# Patient Record
Sex: Female | Born: 1985 | Race: White | Hispanic: No | Marital: Married | Smoking: Never smoker
Health system: Southern US, Community
[De-identification: ages and names within clinical notes are randomized; demographics above are authoritative.]

## PROBLEM LIST (undated history)

## (undated) ENCOUNTER — Inpatient Hospital Stay (HOSPITAL_COMMUNITY): Payer: Self-pay

## (undated) DIAGNOSIS — N200 Calculus of kidney: Secondary | ICD-10-CM

## (undated) HISTORY — DX: Calculus of kidney: N20.0

## (undated) HISTORY — PX: APPENDECTOMY: SHX54

---

## 2012-05-24 ENCOUNTER — Other Ambulatory Visit (HOSPITAL_COMMUNITY): Payer: Self-pay | Admitting: Family Medicine

## 2012-05-24 DIAGNOSIS — O3680X1 Pregnancy with inconclusive fetal viability, fetus 1: Secondary | ICD-10-CM

## 2012-05-25 ENCOUNTER — Other Ambulatory Visit (HOSPITAL_COMMUNITY): Payer: Self-pay | Admitting: Family Medicine

## 2012-05-25 DIAGNOSIS — O3680X1 Pregnancy with inconclusive fetal viability, fetus 1: Secondary | ICD-10-CM

## 2012-05-29 ENCOUNTER — Ambulatory Visit (HOSPITAL_COMMUNITY): Payer: PRIVATE HEALTH INSURANCE

## 2012-06-01 ENCOUNTER — Ambulatory Visit (HOSPITAL_COMMUNITY): Payer: PRIVATE HEALTH INSURANCE

## 2012-06-01 ENCOUNTER — Ambulatory Visit (HOSPITAL_COMMUNITY)
Admission: RE | Admit: 2012-06-01 | Discharge: 2012-06-01 | Disposition: A | Discharge status: Home / Self Care | Payer: PRIVATE HEALTH INSURANCE | Source: Ambulatory Visit | Attending: Family Medicine | Admitting: Family Medicine

## 2012-06-01 DIAGNOSIS — Z3689 Encounter for other specified antenatal screening: Secondary | ICD-10-CM | POA: Insufficient documentation

## 2012-06-01 DIAGNOSIS — O3680X1 Pregnancy with inconclusive fetal viability, fetus 1: Secondary | ICD-10-CM

## 2012-06-01 DIAGNOSIS — O3680X0 Pregnancy with inconclusive fetal viability, not applicable or unspecified: Secondary | ICD-10-CM | POA: Insufficient documentation

## 2012-07-11 LAB — HM PAP SMEAR: HM PAP: NORMAL

## 2013-10-17 ENCOUNTER — Other Ambulatory Visit (HOSPITAL_COMMUNITY): Payer: Self-pay | Admitting: Nurse Practitioner

## 2013-10-17 DIAGNOSIS — O3680X1 Pregnancy with inconclusive fetal viability, fetus 1: Secondary | ICD-10-CM

## 2013-10-22 ENCOUNTER — Ambulatory Visit (HOSPITAL_COMMUNITY)
Admission: RE | Admit: 2013-10-22 | Discharge: 2013-10-22 | Disposition: A | Discharge status: Home / Self Care | Payer: PRIVATE HEALTH INSURANCE | Source: Ambulatory Visit | Attending: Family Medicine | Admitting: Family Medicine

## 2013-10-22 DIAGNOSIS — O3680X0 Pregnancy with inconclusive fetal viability, not applicable or unspecified: Secondary | ICD-10-CM | POA: Insufficient documentation

## 2013-10-22 DIAGNOSIS — O3680X1 Pregnancy with inconclusive fetal viability, fetus 1: Secondary | ICD-10-CM

## 2013-12-26 IMAGING — US US OB TRANSVAGINAL
1 series · 13 of 28 positions shown · non-contrast
Comparison: none

[Series 1: us ob comp less 14 wks · 13 of 83 slices shown]
[im 4/83]
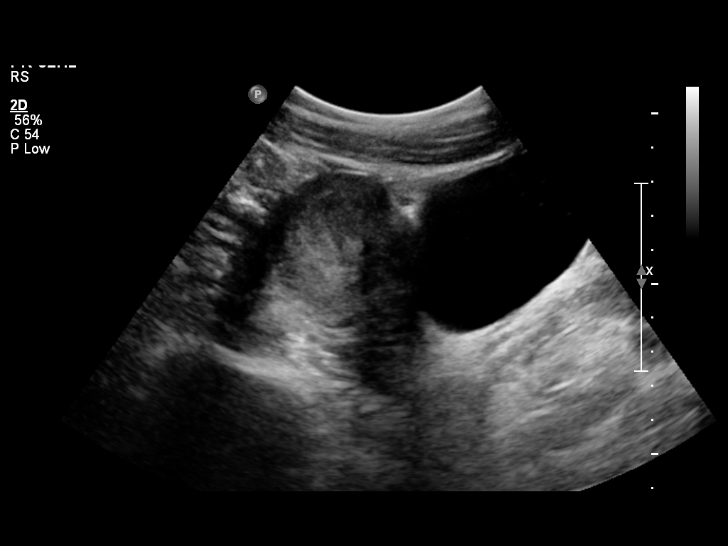
[im 10/83]
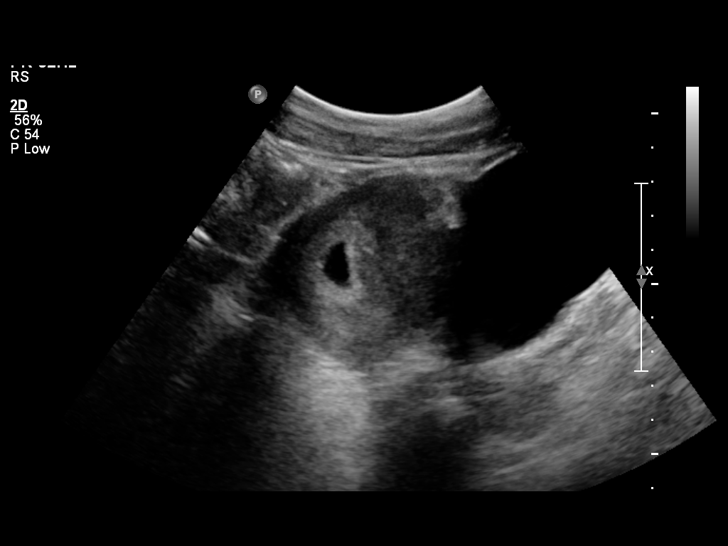
[im 16/83]
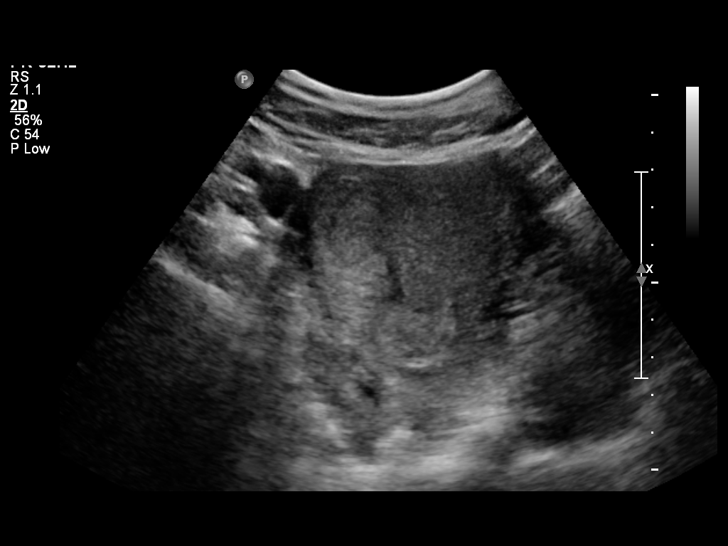
[im 22/83]
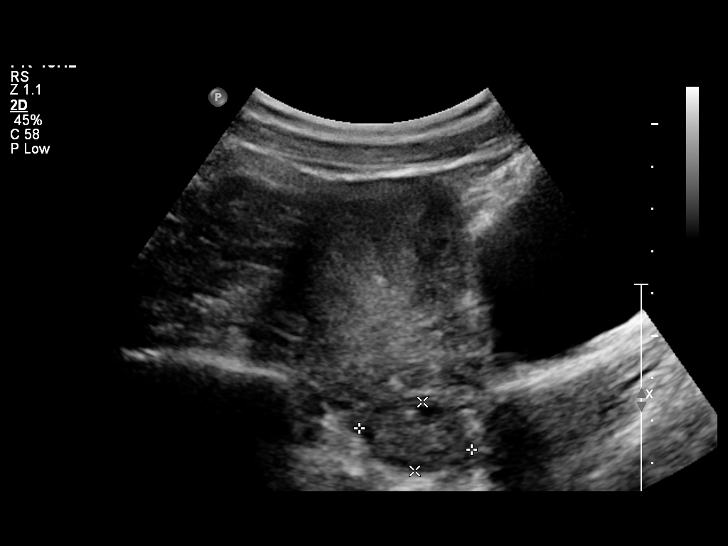
[im 28/83]
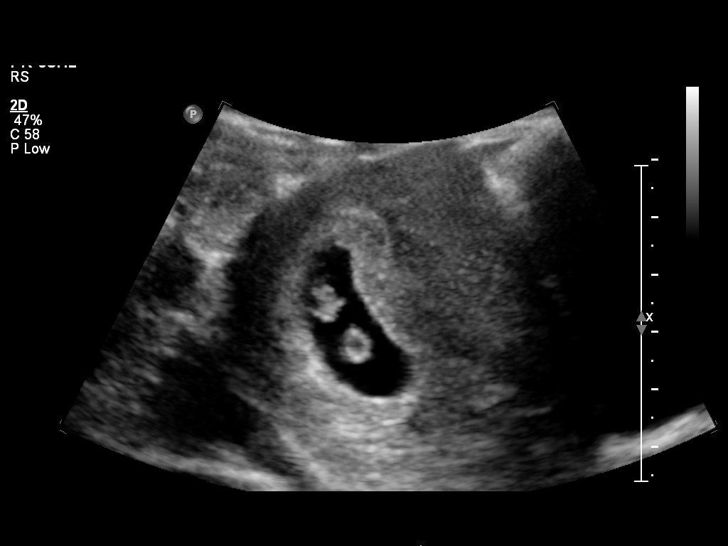
[im 34/83]
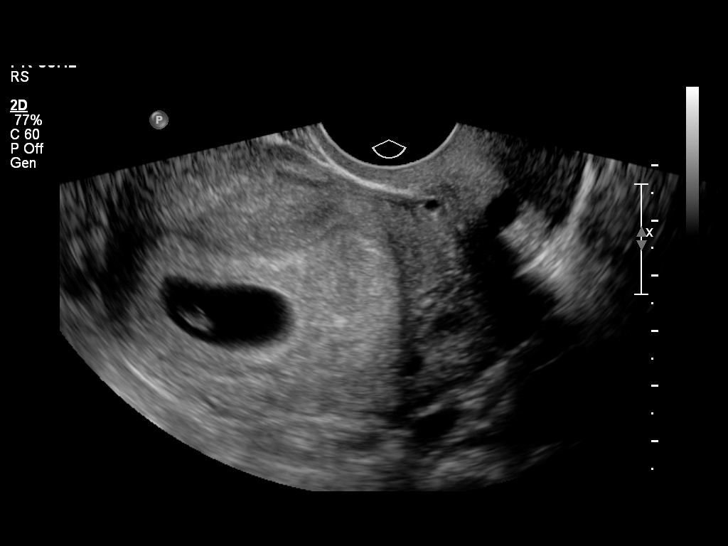
[im 43/83]
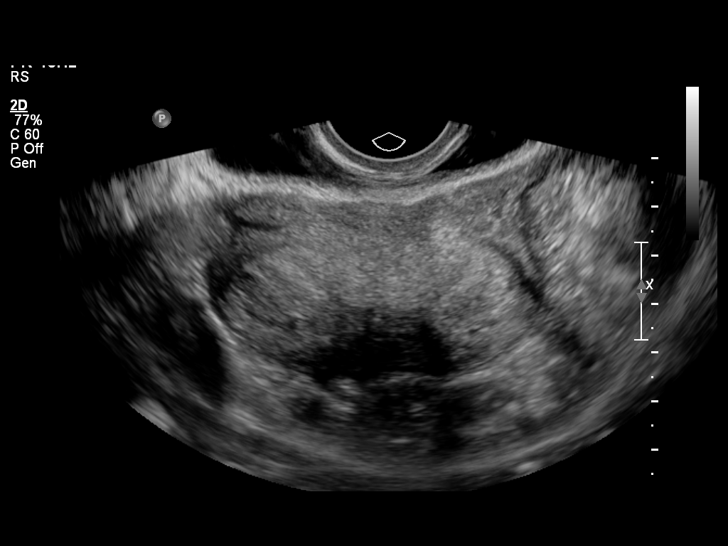
[im 49/83]
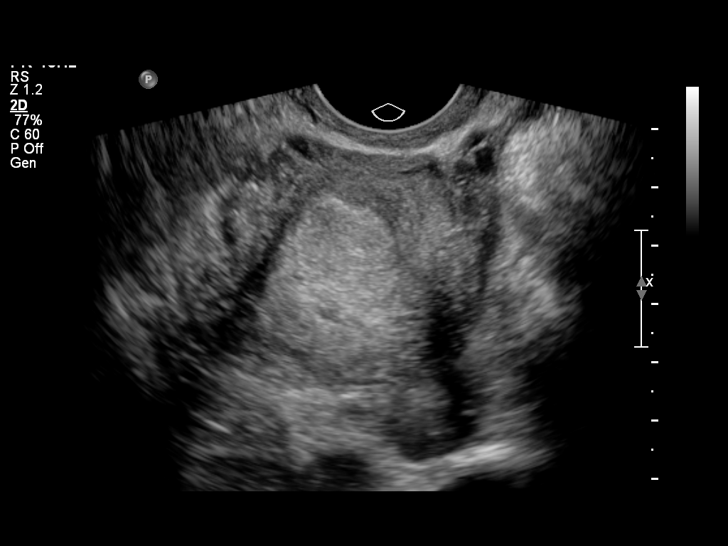
[im 55/83]
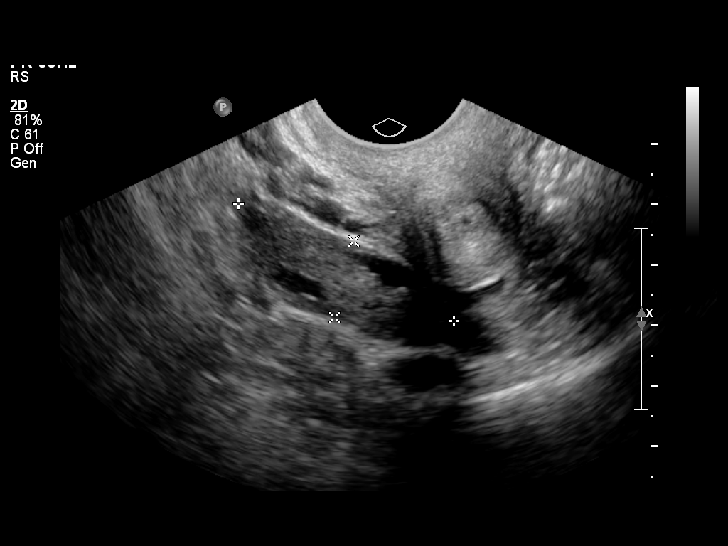
[im 61/83]
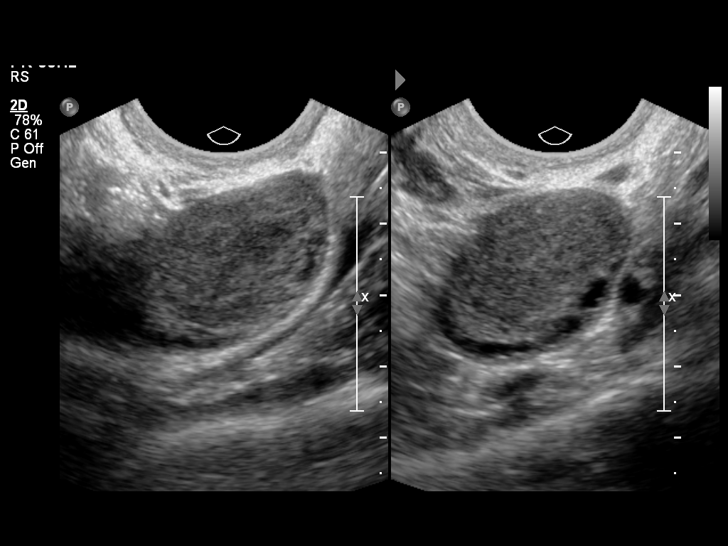
[im 67/83]
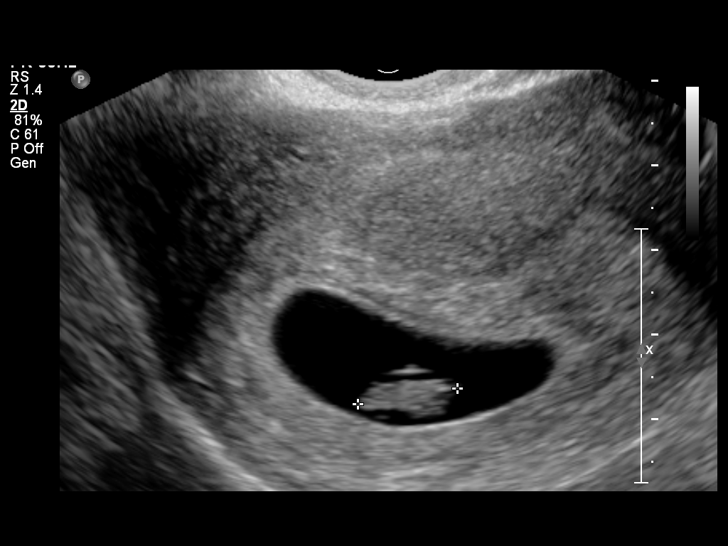
[im 73/83]
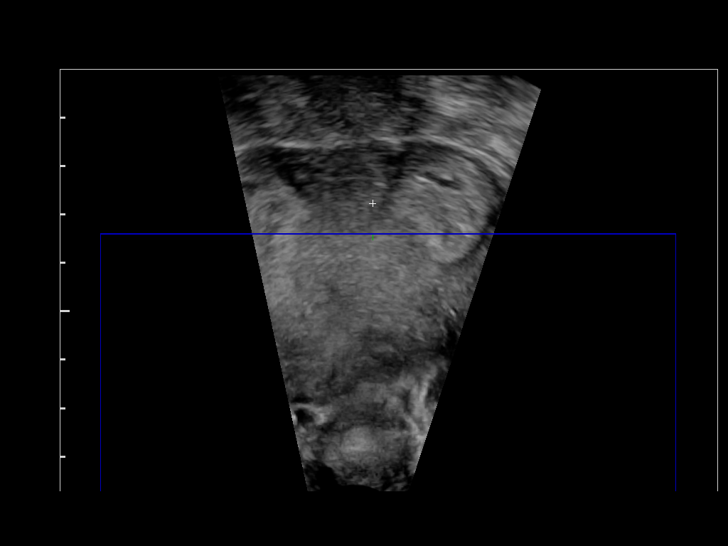
[im 79/83]
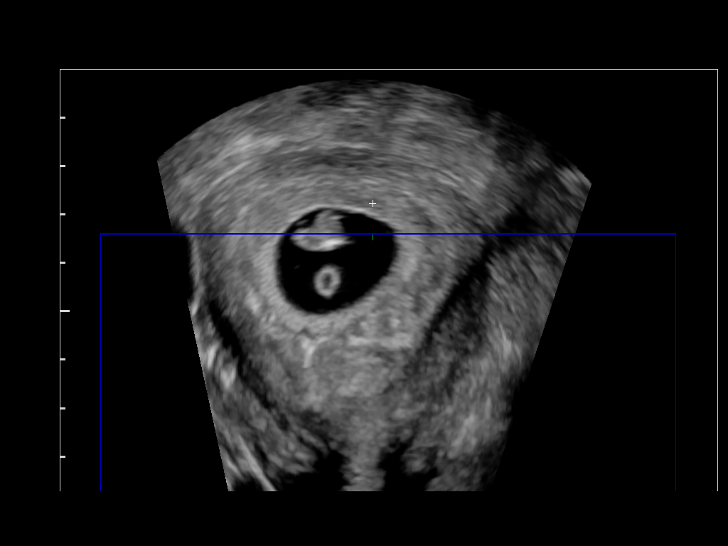

[13 of 28 positions shown; findings below may reference images not displayed]

OBSTETRICS REPORT
                      (Signed Final 06/01/2012 [DATE])

Service(s) Provided

 US OB COMP LESS 14 WKS                                76801.0
 US OB TRANSVAGINAL                                    76817.0
Indications

 Pregnancy with inconclusive fetal viability
 Long cycles (45 days)
Fetal Evaluation

 Num Of Fetuses:    1
 Preg. Location:    Intrauterine
 Gest. Sac:         Intrauterine
 Yolk Sac:          Visualized
 Fetal Pole:        Visualized
 Fetal Heart Rate:  140                         bpm
 Cardiac Activity:  Observed
Biometry

 CRL:     11.7  mm    G. Age:   7w 2d                  EDD:   01/16/13
Gestational Age

 LMP:           8w 6d        Date:   03/31/12                 EDD:   01/05/13
 Best:          7w 2d     Det. By:   U/S C R L (06/01/12)     EDD:   01/16/13
Cervix Uterus Adnexa

 Cervix:       Normal appearance by transvaginal scan
 Uterus:       Bicornuate vs arcuate
 Cul De Sac:   No free fluid seen.
 Left Ovary:   Size(cm) L: 2.84 x W: 2.02 x H: 2.03  Volume(cc):
               Small corpus luteum noted.
 Right Ovary:  Size(cm) L: 3.37 x W: 2.11 x H: 1.46  Volume(cc):
 Adnexa:     No abnormality visualized.
Impression

 Single living intrauterine embryo. The estimated gestational
 age is 7w 2d based on U/S C R L (06/01/12).Bicornuate vs
 arcuate uterine anatomy.
 questions or concerns.

## 2014-01-20 ENCOUNTER — Inpatient Hospital Stay (HOSPITAL_COMMUNITY): Payer: PRIVATE HEALTH INSURANCE

## 2014-01-20 ENCOUNTER — Inpatient Hospital Stay (HOSPITAL_COMMUNITY)
Admission: AD | Admit: 2014-01-20 | Discharge: 2014-01-20 | Disposition: A | Discharge status: Home / Self Care | Payer: PRIVATE HEALTH INSURANCE | Source: Ambulatory Visit | Attending: Obstetrics & Gynecology | Admitting: Obstetrics & Gynecology

## 2014-01-20 ENCOUNTER — Encounter (HOSPITAL_COMMUNITY): Payer: Self-pay | Admitting: *Deleted

## 2014-01-20 DIAGNOSIS — R1012 Left upper quadrant pain: Secondary | ICD-10-CM

## 2014-01-20 DIAGNOSIS — Z3A23 23 weeks gestation of pregnancy: Secondary | ICD-10-CM | POA: Insufficient documentation

## 2014-01-20 DIAGNOSIS — O9989 Other specified diseases and conditions complicating pregnancy, childbirth and the puerperium: Secondary | ICD-10-CM | POA: Insufficient documentation

## 2014-01-20 DIAGNOSIS — R109 Unspecified abdominal pain: Secondary | ICD-10-CM | POA: Diagnosis present

## 2014-01-20 LAB — URINALYSIS, ROUTINE W REFLEX MICROSCOPIC
Bilirubin Urine: NEGATIVE
GLUCOSE, UA: NEGATIVE mg/dL
Hgb urine dipstick: NEGATIVE
Ketones, ur: NEGATIVE mg/dL
Leukocytes, UA: NEGATIVE
Nitrite: NEGATIVE
PROTEIN: NEGATIVE mg/dL
Urobilinogen, UA: 0.2 mg/dL (ref 0.0–1.0)
pH: 6 (ref 5.0–8.0)

## 2014-01-20 MED ORDER — HYDROCODONE-ACETAMINOPHEN 5-325 MG PO TABS
1.0000 | ORAL_TABLET | Freq: Once | ORAL | Status: AC
  Administered 2014-01-20: 1 via ORAL
  Filled 2014-01-20: qty 1

## 2014-01-20 MED ORDER — HYDROCODONE-ACETAMINOPHEN 5-325 MG PO TABS: 1.0000 | ORAL_TABLET | ORAL | Status: DC | PRN

## 2014-01-20 NOTE — MAU Note (Addendum)
Patient presents with left-sided flank pain and chills since Friday.

## 2014-01-20 NOTE — MAU Provider Note (Signed)
History     CSN: [id number]  Arrival date and time: 01/20/14 [id number]   First Provider Initiated Contact with Patient 01/20/14 2006      Chief Complaint  Patient presents with  . Flank Pain  . Chills   HPI  Stacie Chandler is a 28 y.o. G2P1002 at 6420w4d who presents today with left sided flank pain and chills since Friday. She rates her current pain 4/10. She states that she had the pain on Thursday, and it resolved on its own. However, it returned Friday and she had blood in her urine. She denies any hx of kidney stones. She denies any fever, but has had chills. [facility] [place] She went to urgent care on Friday and was started on Macrobid. She has been taking 1000 mg of tylenol every 6 hours. She last took tylenol about 6 hours ago.   She denies any contractions, VB or LOF. She states that the fetus has been active.   Past Medical History  Diagnosis Date  . Medical history non-contributory     Past Surgical History  Procedure Laterality Date  . Appendectomy      History reviewed. No pertinent family history.  History  Substance Use Topics  . Smoking status: Never Smoker   . Smokeless tobacco: Never Used  . Alcohol Use: No     Comment: socially    Allergies:  Allergies  Allergen Reactions  . Flagyl [Metronidazole] Rash    Prescriptions prior to admission  Medication Sig Dispense Refill Last Dose  . acetaminophen (TYLENOL) 500 MG tablet Take 1,000 mg by mouth every 6 (six) hours as needed for moderate pain.   01/20/2014 at Unknown time  . nitrofurantoin (MACRODANTIN) 100 MG capsule Take 100 mg by mouth 2 (two) times daily.   01/20/2014 at Unknown time  . Prenatal Vit-Fe Fumarate-FA (PRENATAL MULTIVITAMIN) TABS tablet Take 1 tablet by mouth daily at 12 noon.   Past Month at Unknown time    ROS Physical Exam   Blood pressure 148/73, pulse 70, temperature 98.2 F (36.8 C), temperature source Oral, resp. rate 16, height 5' 3.5" (1.613 m),  weight 75.411 kg (166 lb 4 oz).  Physical Exam  Nursing note and vitals reviewed. Constitutional: She is oriented to person, place, and time. She appears well-developed and well-nourished. No distress.  Cardiovascular: Normal rate.   Respiratory: Effort normal.  GI: Soft. There is no tenderness. There is no rebound.  Genitourinary:  Left sided CVA tenderness No CVA tenderness on right   Neurological: She is alert and oriented to person, place, and time.  Skin: Skin is warm and dry.  Psychiatric: She has a normal mood and affect.   FHT:145 moderate with 15x15 accels Toco: no contractions  MAU Course  Procedures Results for orders placed or performed during the hospital encounter of 01/20/14 (from the past 24 hour(s))  Urinalysis, Routine w reflex microscopic     Status: Abnormal   Collection Time: 01/20/14  6:42 PM  Result Value Ref Range   Color, Urine YELLOW YELLOW   APPearance CLEAR CLEAR   Specific Gravity, Urine <1.005 (L) 1.005 - 1.030   pH 6.0 5.0 - 8.0   Glucose, UA NEGATIVE NEGATIVE mg/dL   Hgb urine dipstick NEGATIVE NEGATIVE   Bilirubin Urine NEGATIVE NEGATIVE   Ketones, ur NEGATIVE NEGATIVE mg/dL   Protein, ur NEGATIVE NEGATIVE mg/dL   Urobilinogen, UA 0.2 0.0 - 1.0 mg/dL   Nitrite NEGATIVE NEGATIVE   Leukocytes,  UA NEGATIVE NEGATIVE   [place] Renal  01/20/2014   CLINICAL DATA:  Pregnant patient. Now with left-sided flank pain and chills.  EXAM: RENAL/URINARY TRACT ULTRASOUND COMPLETE  COMPARISON:  None.  FINDINGS: Right Kidney:  Normal cortical thickness, echogenicity and size, measuring 11.8 cm in length. No focal renal lesions. No echogenic renal stones. No urinary obstruction.  Left Kidney:  Normal cortical thickness, echogenicity and size, measuring 12.2 cm in length. No focal renal lesions. No echogenic renal stones. There is very mild left-sided pelvicaliectasis (image 18).  Bladder:  Appears normal for degree of bladder distention. Only a right-sided ureteral jet  is identified.  IMPRESSION: 1. Mild left-sided pelvicaliectasis, potentially the sequela of patient's gravid state, however there is non visualization of a left-sided ureteral jet. Follow-up renal ultrasound could be performed as clinically indicated. 2. No evidence of right-sided urinary obstruction.   Electronically Signed   By: Simonne ComeJohn  Watts M.D.   On: 01/20/2014 21:11   Urine clear and afebrile here   Assessment and Plan   1. Acute left flank pain    Probable left kidney stone Continue Macrobid Increase PO hydration Vicodin PRN Return precautions reviewed   Tawnya CrookHogan, Daleysa Kristiansen Donovan 01/20/2014, 8:08 PM

## 2014-01-20 NOTE — Discharge Instructions (Signed)

## 2014-01-22 LAB — URINE CULTURE
COLONY COUNT: NO GROWTH
Culture: NO GROWTH

## 2014-02-07 ENCOUNTER — Ambulatory Visit (INDEPENDENT_AMBULATORY_CARE_PROVIDER_SITE_OTHER): Payer: PRIVATE HEALTH INSURANCE | Admitting: Family

## 2014-02-07 ENCOUNTER — Encounter: Payer: Self-pay | Admitting: Family

## 2014-02-07 VITALS — BP 110/70 | HR 64 | Temp 98.0°F | Ht 63.5 in | Wt 168.0 lb

## 2014-02-07 DIAGNOSIS — Z87442 Personal history of urinary calculi: Secondary | ICD-10-CM

## 2014-02-07 DIAGNOSIS — Z0000 Encounter for general adult medical examination without abnormal findings: Secondary | ICD-10-CM

## 2014-02-07 NOTE — Patient Instructions (Signed)

## 2014-02-07 NOTE — Progress Notes (Signed)
Pre visit review using our clinic review tool, if applicable. No additional management support is needed unless otherwise documented below in the visit note. 

## 2014-02-10 NOTE — Progress Notes (Signed)
Subjective:    Patient ID: Stacie FlavinJenna Chandler, female    DOB: 21-Sep-1985, 28 y.o.   MRN: [id number]  HPI 28 year old white female, G2P1, nonsmoker, is in today, to be established and for CPX. She is in her 2nd trimester of pregnancy. [facility] where she is cared for by midwives. Has a history of elevated cholesterol but does not require medication. 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    Also has a history of renal calculi. First episode was this past summer.    Review of Systems  HENT: Negative.   Eyes: Negative.   Respiratory: Negative.   Cardiovascular: Negative.   Gastrointestinal: Negative.   Endocrine: Negative.   Genitourinary: Negative.        2nd trimester pregnancy  Musculoskeletal: Negative.   Skin: Negative.   Allergic/Immunologic: Negative.   Neurological: Negative.   Hematological: Negative.   Psychiatric/Behavioral: Negative.    Past Medical History  Diagnosis Date  . Medical history non-contributory   . Kidney stone     History   Social History  . Marital Status: Married    Spouse Name: N/A    Number of Children: N/A  . Years of Education: N/A   Occupational History  . Not on file.   Social History Main Topics  . Smoking status: Never Smoker   . Smokeless tobacco: Never Used  . Alcohol Use: No     Comment: socially  . Drug Use: No  . Sexual Activity: Yes    Birth Control/ Protection: None   Other Topics Concern  . Not on file   Social History Narrative    Past Surgical History  Procedure Laterality Date  . Appendectomy      History reviewed. No pertinent family history.  Allergies  Allergen Reactions  . Flagyl [Metronidazole] Rash    Current Outpatient Prescriptions on File Prior to Visit  Medication Sig Dispense Refill  . Prenatal Vit-Fe Fumarate-FA (PRENATAL MULTIVITAMIN) TABS tablet Take 1 tablet by mouth daily at 12 noon.     No current facility-administered  medications on file prior to visit.    BP 110/70 mmHg  Pulse 64  Temp(Src) 98 F (36.7 C) (Oral)  Ht 5' 3.5" (1.613 m)  Wt 168 lb (76.204 kg)  BMI 29.29 kg/m2chart    Objective:   Physical Exam  Constitutional: She is oriented to person, place, and time. She appears well-developed and well-nourished.  HENT:  Head: Normocephalic and atraumatic.  Right Ear: External ear normal.  Left Ear: External ear normal.  Nose: Nose normal.  Mouth/Throat: Oropharynx is clear and moist.  Eyes: Conjunctivae and EOM are normal. Pupils are equal, round, and reactive to light.  Neck: Normal range of motion. Neck supple. No thyromegaly present.  Cardiovascular: Normal rate, regular rhythm and normal heart sounds.   Pulmonary/Chest: Effort normal and breath sounds normal.  Abdominal: Soft. Bowel sounds are normal.  Musculoskeletal: Normal range of motion.  Neurological: She is alert and oriented to person, place, and time. She has normal reflexes. She displays normal reflexes. No cranial nerve deficit. Coordination normal.  Skin: Skin is warm and dry.  Psychiatric: She has a normal mood and affect.          Assessment & Plan:  Stacie Chandler was seen today for establish care.  Diagnoses and associated orders for this visit:  Preventative health care - TSH; Future - CMP; Future - Lipid Panel; Future - POC  Urinalysis Dipstick - CBC with Differential; Future  History of renal calculi   Hypercholesterolemia: Controlled  Encouraged healthy diet, monthly self breast exams. Continue prenatal care. Follow-up for fasting labs. Recheck in one year and sooner as needed.

## 2014-03-12 ENCOUNTER — Other Ambulatory Visit (INDEPENDENT_AMBULATORY_CARE_PROVIDER_SITE_OTHER): Payer: PRIVATE HEALTH INSURANCE

## 2014-03-12 DIAGNOSIS — Z79899 Other long term (current) drug therapy: Secondary | ICD-10-CM

## 2014-03-12 DIAGNOSIS — Z0000 Encounter for general adult medical examination without abnormal findings: Secondary | ICD-10-CM | POA: Diagnosis not present

## 2014-03-12 LAB — POCT URINALYSIS DIPSTICK
Bilirubin, UA: NEGATIVE
Blood, UA: NEGATIVE
Glucose, UA: NEGATIVE
Ketones, UA: NEGATIVE
Leukocytes, UA: NEGATIVE
Nitrite, UA: NEGATIVE
Protein, UA: NEGATIVE
Spec Grav, UA: 1.015
Urobilinogen, UA: 0.2
pH, UA: 7

## 2014-03-12 LAB — LIPID PANEL
Cholesterol: 275 mg/dL — ABNORMAL HIGH (ref 0–200)
HDL: 72.2 mg/dL (ref >39.00)
LDL Cholesterol: 171 mg/dL — ABNORMAL HIGH (ref 0–99)
NonHDL: 202.8
Total CHOL/HDL Ratio: 4
Triglycerides: 161 mg/dL — ABNORMAL HIGH (ref 0.0–149.0)
VLDL: 32.2 mg/dL (ref 0.0–40.0)

## 2014-03-12 LAB — TSH: TSH: 3.43 u[IU]/mL (ref 0.35–4.50)

## 2014-03-12 LAB — CBC WITH DIFFERENTIAL/PLATELET
Basophils Absolute: 0 10*3/uL (ref 0.0–0.1)
Basophils Relative: 0.4 % (ref 0.0–3.0)
Eosinophils Absolute: 0.5 10*3/uL (ref 0.0–0.7)
Eosinophils Relative: 4.4 % (ref 0.0–5.0)
HCT: 35.7 % — ABNORMAL LOW (ref 36.0–46.0)
Hemoglobin: 12.4 g/dL (ref 12.0–15.0)
Lymphocytes Relative: 21.4 % (ref 12.0–46.0)
Lymphs Abs: 2.3 10*3/uL (ref 0.7–4.0)
MCHC: 34.6 g/dL (ref 30.0–36.0)
MCV: 89.6 fl (ref 78.0–100.0)
Monocytes Absolute: 0.7 10*3/uL (ref 0.1–1.0)
Monocytes Relative: 6.3 % (ref 3.0–12.0)
Neutro Abs: 7.2 10*3/uL (ref 1.4–7.7)
Neutrophils Relative %: 67.5 % (ref 43.0–77.0)
Platelets: 285 10*3/uL (ref 150.0–400.0)
RBC: 3.99 Mil/uL (ref 3.87–5.11)
RDW: 13 % (ref 11.5–15.5)
WBC: 10.6 10*3/uL — ABNORMAL HIGH (ref 4.0–10.5)

## 2014-03-12 LAB — COMPREHENSIVE METABOLIC PANEL
ALT: 10 U/L (ref 0–35)
AST: 16 U/L (ref 0–37)
Albumin: 3.3 g/dL — ABNORMAL LOW (ref 3.5–5.2)
Alkaline Phosphatase: 94 U/L (ref 39–117)
BILIRUBIN TOTAL: 0.3 mg/dL (ref 0.2–1.2)
BUN: 8 mg/dL (ref 6–23)
CHLORIDE: 104 meq/L (ref 96–112)
CO2: 25 mEq/L (ref 19–32)
Calcium: 8.8 mg/dL (ref 8.4–10.5)
Creatinine, Ser: 0.61 mg/dL (ref 0.40–1.20)
GFR: 122.33 mL/min (ref >60.00)
GLUCOSE: 85 mg/dL (ref 70–99)
Potassium: 3.8 mEq/L (ref 3.5–5.1)
Sodium: 135 mEq/L (ref 135–145)
TOTAL PROTEIN: 6.2 g/dL (ref 6.0–8.3)

## 2014-03-12 NOTE — Patient Instructions (Signed)
, °

## 2014-08-15 ENCOUNTER — Ambulatory Visit (INDEPENDENT_AMBULATORY_CARE_PROVIDER_SITE_OTHER): Payer: PRIVATE HEALTH INSURANCE | Admitting: Family Medicine

## 2014-08-15 ENCOUNTER — Encounter: Payer: Self-pay | Admitting: Family Medicine

## 2014-08-15 VITALS — BP 140/80 | HR 64 | Temp 98.1°F | Wt 158.0 lb

## 2014-08-15 DIAGNOSIS — M67911 Unspecified disorder of synovium and tendon, right shoulder: Secondary | ICD-10-CM

## 2014-08-15 DIAGNOSIS — M75101 Unspecified rotator cuff tear or rupture of right shoulder, not specified as traumatic: Secondary | ICD-10-CM | POA: Diagnosis not present

## 2014-08-15 DIAGNOSIS — E781 Pure hyperglyceridemia: Secondary | ICD-10-CM | POA: Diagnosis not present

## 2014-08-15 NOTE — Progress Notes (Signed)
Stacie Conch, MD Phone: [phone]  Subjective:  Patient presents today to establish care with me as their new primary care provider. Patient was formerly a patient of Dr. Orvan Falconer. Chief complaint-noted.   See problem oriented charting ROS- no chest pain, shortness of breath, arm weakness, paresthesias  The following were reviewed and entered/updated in epic: Past Medical History  Diagnosis Date  . Kidney stone     2nd trimester of pregnancy, no recurrence   Patient Active Problem List   Diagnosis Date Noted  . History of renal calculi 02/07/2014   Past Surgical History  Procedure Laterality Date  . Appendectomy      Family History  Problem Relation Age of Onset  . Hyperlipidemia Mother   . Hyperlipidemia Father   . Breast cancer      maternal greatgrandmother  . Lymphoma Paternal Grandmother   . Hypertension Mother     Medications- reviewed and updated Current Outpatient Prescriptions  Medication Sig Dispense Refill  . Prenatal Vit-Fe Fumarate-FA (PRENATAL MULTIVITAMIN) TABS tablet Take 1 tablet by mouth daily at 12 noon.     No current facility-administered medications for this visit.    Allergies-reviewed and updated Allergies  Allergen Reactions  . Flagyl [Metronidazole] Rash    History   Social History  . Marital Status: Married    Spouse Name: N/A  . Number of Children: N/A  . Years of Education: N/A   Social History Main Topics  . Smoking status: Never Smoker   . Smokeless tobacco: Never Used  . Alcohol Use: 0.0 oz/week    0 Standard drinks or equivalent per week     Comment: socially- pump and dump during lactation. 2-3x a month 2 drinks or so  . Drug Use: No  . Sexual Activity: Yes    Birth Control/ Protection: None   Other Topics Concern  . None   Social History Narrative   Married (husband Acupuncturist ER doc and patient of Dr. Durene Cal). 2 children 3.5 months and 18 months 07/2014.       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       Hobbies: planning half marathon in October, has done sprint triathalon, cross stitch, play video games          ROS--See HPI   Objective: BP 140/80 mmHg  Pulse 64  Temp(Src) 98.1 F (36.7 C)  Wt 158 lb (71.668 kg) Gen: NAD, resting comfortably HEENT: Mucous membranes are moist. Oropharynx normal Neck: no thyromegaly CV: RRR  Lungs: no labored breathing Abdomen: soft/nontender/nondistended/normal bowel sounds. Overweight (pregnant 3.5 months ago)  Ext: no edema Skin: warm, dry Neuro: grossly normal, moves all extremities, PERRLA  Shoulder: Inspection reveals no abnormalities, atrophy or asymmetry. Palpation is normal with no tenderness over AC joint or bicipital groove. ROM is full in all planes. Rotator cuff strength normal throughout. No signs of impingement with negative Neer and Hawkin's tests, empty can. Mild painful ar. no drop arm sign. Pain with resisted external rotation but not IR, but interestingly push off causes most pain (subscap likely)   Assessment/Plan:  1. Hypertriglyceridemia S: noted in 3rd trimester pregnancy along with elevated LDL. Through review up to date, common to have elevations in lipids in pregnancy especially 3rd trimester Lab Results  Component Value Date   CHOL 275* 03/12/2014   HDL 72.20 03/12/2014   LDLCALC 171* 03/12/2014   TRIG 161.0* 03/12/2014   CHOLHDL 4 03/12/2014  A/P: repeat Lipids= patient to  return fasting, doubt would intervene other than diet/exercise even if elevated to levels in pregnancy.   2. Going to [place] leaving September 7th- for mission trip A/P: referred to travel medicine clinic for their expertise  3.  R. Shoulder pain S: No trauma.  worse with unstrapping bra or shutting door. About 3 months. 3/10 at its worse, not life limiting.  A/P: Suspect subscapularis injury as back push off most prominent but also slightly painful arc as well as painful resisted external rotation. Gave  home exercise program to work through. If no improvement, could use steroid injection, refer to sports medicine or use formal PT. We will hold off on nsaid course per her preference though may give additional benefit.   Return 1 year. Keep an eye on BP- mild elevation today previously controlled. Get records for Pap and Tdap from birthing center or ob/gyn.   Orders Placed This Encounter  Procedures  . Lipid panel    [place]    Standing Status: Future     Number of Occurrences:      Standing Expiration Date: 08/15/2015    Order Specific Question:  Has the patient fasted?    Answer:  No

## 2014-08-15 NOTE — Patient Instructions (Addendum)
Sign release of information at the front desk for pap smear and immunizations from ob/gyn or from birth clinic in [place].  Come back at your convenience for fasting cholesterol levels  Call travel medicine at [phone]. Hopefully they could do a joint visit with you and your husband as will review similar information. Hope you have a great trip to [place]!   Do home exercise program for your rotator cuff. If you have more than just the mildest of the pain, hold off on that exercise and give it another week before trying again. We could send you to formal PT or do an injection or get you into our sports medicine guy if needed  Happy to see you as needed or in a year for a physical.

## 2014-08-20 ENCOUNTER — Encounter: Payer: Self-pay | Admitting: Family Medicine

## 2014-09-01 ENCOUNTER — Other Ambulatory Visit (INDEPENDENT_AMBULATORY_CARE_PROVIDER_SITE_OTHER): Payer: PRIVATE HEALTH INSURANCE

## 2014-09-01 DIAGNOSIS — E781 Pure hyperglyceridemia: Secondary | ICD-10-CM

## 2014-09-01 LAB — LIPID PANEL
Cholesterol: 205 mg/dL — ABNORMAL HIGH (ref 0–200)
HDL: 79.9 mg/dL (ref >39.00)
LDL CALC: 116 mg/dL — AB (ref 0–99)
NONHDL: 125.1
Total CHOL/HDL Ratio: 3
Triglycerides: 45 mg/dL (ref 0.0–149.0)
VLDL: 9 mg/dL (ref 0.0–40.0)

## 2014-11-05 ENCOUNTER — Telehealth: Payer: Self-pay | Admitting: Family Medicine

## 2014-11-05 NOTE — Telephone Encounter (Signed)
Pt has a cough and would like an appt tomorrow. Can I use a sda slot?

## 2014-11-05 NOTE — Telephone Encounter (Signed)
Pt has been sch

## 2014-11-05 NOTE — Telephone Encounter (Signed)
yes

## 2014-11-06 ENCOUNTER — Ambulatory Visit (INDEPENDENT_AMBULATORY_CARE_PROVIDER_SITE_OTHER): Payer: PRIVATE HEALTH INSURANCE | Admitting: Family Medicine

## 2014-11-06 ENCOUNTER — Encounter: Payer: Self-pay | Admitting: Family Medicine

## 2014-11-06 VITALS — BP 120/70 | HR 81 | Temp 98.0°F | Wt 152.0 lb

## 2014-11-06 DIAGNOSIS — R599 Enlarged lymph nodes, unspecified: Secondary | ICD-10-CM

## 2014-11-06 DIAGNOSIS — R590 Localized enlarged lymph nodes: Secondary | ICD-10-CM

## 2014-11-06 LAB — COMPREHENSIVE METABOLIC PANEL
ALBUMIN: 4.2 g/dL (ref 3.5–5.2)
ALT: 11 U/L (ref 0–35)
AST: 16 U/L (ref 0–37)
Alkaline Phosphatase: 70 U/L (ref 39–117)
BILIRUBIN TOTAL: 0.5 mg/dL (ref 0.2–1.2)
BUN: 12 mg/dL (ref 6–23)
CALCIUM: 9.8 mg/dL (ref 8.4–10.5)
CO2: 29 mEq/L (ref 19–32)
Chloride: 104 mEq/L (ref 96–112)
Creatinine, Ser: 0.67 mg/dL (ref 0.40–1.20)
GFR: 109.3 mL/min (ref >60.00)
GLUCOSE: 80 mg/dL (ref 70–99)
Potassium: 3.9 mEq/L (ref 3.5–5.1)
Sodium: 140 mEq/L (ref 135–145)
TOTAL PROTEIN: 7.3 g/dL (ref 6.0–8.3)

## 2014-11-06 LAB — CBC WITH DIFFERENTIAL/PLATELET
Basophils Absolute: 0 10*3/uL (ref 0.0–0.1)
Basophils Relative: 0.9 % (ref 0.0–3.0)
EOS PCT: 4.6 % (ref 0.0–5.0)
Eosinophils Absolute: 0.2 10*3/uL (ref 0.0–0.7)
HEMATOCRIT: 39.7 % (ref 36.0–46.0)
Hemoglobin: 13.4 g/dL (ref 12.0–15.0)
Lymphocytes Relative: 47.8 % — ABNORMAL HIGH (ref 12.0–46.0)
Lymphs Abs: 2.5 10*3/uL (ref 0.7–4.0)
MCHC: 33.8 g/dL (ref 30.0–36.0)
MCV: 86.1 fl (ref 78.0–100.0)
MONOS PCT: 8 % (ref 3.0–12.0)
Monocytes Absolute: 0.4 10*3/uL (ref 0.1–1.0)
NEUTROS PCT: 38.7 % — AB (ref 43.0–77.0)
Neutro Abs: 2 10*3/uL (ref 1.4–7.7)
Platelets: 364 10*3/uL (ref 150.0–400.0)
RBC: 4.61 Mil/uL (ref 3.87–5.11)
RDW: 12.2 % (ref 11.5–15.5)
WBC: 5.2 10*3/uL (ref 4.0–10.5)

## 2014-11-06 NOTE — Assessment & Plan Note (Addendum)
S:noted swollen lymph node in right groin 2.5 weeks ago. Isolated. About 1 cm in size. Has not grown. She's never had anything like this before. No pain or discomfort. Noted when putting clothes on. She has had 2 months productive cough in AM. Sore throat first 3 days then does not Got sick from husband who works in ED. Husband sick for 5-6 weeks. They both had crackles and wheezes which resolved. 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 While there, got traveler's diarrhea and took azithromycin. Day later had tonsilitis which physicians told her was adequate that she was already on azithromycin. That has drastically improved. Azithromycin did not resolve the cough.  ROS-no fever, chills, unintentional weight loss, abnormal fatigue. No bug bites or infections on legs.  A/P: Isolated lymphoadenopathy in groin (no obvious infectious cause, up to date on pap smear). Remaining malignancy concern could potentially be ovarian source or lymphoma or this could simple be benign and idiopathic. We discussed it has been 2.5 weeks, if persists to 4 weeks, refer to general surgery for open biopsy of node. CBC with diff and CMP today as well- consider earlier referral if abnormalities. Cough could be allergy related- patient declined CXR for 2 months of cough for possible walking PNA.

## 2014-11-06 NOTE — Patient Instructions (Signed)
Check CBC with diff to see if any cell line abnormalities.  Check CMET to make sure no changes- doubt it.   We agreed that if this area persists (not in typical area for infection to cause this) for 4 weeks, we would proceed to biopsy. Would likely refer to general surgery for this.    From U[facility]: SUMMARY - Peripheral lymphadenopathy without an obvious cause after the history and physical examination presents a diagnostic dilemma. On the one hand, there are countless potential causes, some of which are severe and treatable (table 1). On the other, severe causes are uncommon and the best way to reach a definitive diagnosis is by biopsy, which is invasive and not justified in most cases. Normal anatomy - The location of peripheral lymph node groups is shown schematically in the figures (figure 1 and figure 2). Normal lymph nodes are usually less than 1 cm in diameter and tend to be larger in adolescence than later in life. Lymph nodes are often palpable in the inguinal region in healthy people, and may also be palpable in the neck following head and neck infections. Clinician and patient concerns - Many patients with unexplained lymphadenopathy (and their clinicians) are concerned about the possibility of malignancy. While prevalence of malignancy in lymph node biopsies performed in referral centers approaches 60 percent, when patients were referred from primary care providers, the prevalence of malignancy was 17 percent. However, in a typical primary care practice, the prevalence of malignancy in such cases is as low as 1 percent. Approach to the patient - The cause of lymphadenopathy is often obvious after a complete history (eg, symptoms, animal exposure, foreign travel, medications and drug use (table 2), sexual behavior) and physical examination (eg, lymph node location, size, consistency, fixation, tenderness). In more difficult cases, laboratory tests and lymph node biopsy may be necessary. (See  'Diagnostic approach' above.) ?Clues to the diagnosis may be obtained depending upon whether the lymphadenopathy is localized or generalized. (See 'Localized lymphadenopathy' above and 'Generalized lymphadenopathy' above.) ?Patients with localized lymphadenopathy can be observed for three to four weeks if there is nothing else in the history and physical examination to suggest malignancy. (See 'Observation over time' above.) ?Biopsy is appropriate if an abnormal node has not resolved after four weeks and should be performed immediately in patients with other findings suggesting malignancy (eg, systemic complaints of fever, night sweats, weight loss). Open biopsy generally is the best diagnostic test.

## 2014-11-06 NOTE — Progress Notes (Signed)
Tana Conch, MD  Subjective:  Stacie Chandler is a 29 y.o. year old very pleasant female patient who presents for/with See problem oriented charting ROS- no chest pain, shortness of breath, fever since GI illness in [place]  Past Medical History-  Patient Active Problem List   Diagnosis Date Noted  . Inguinal lymphadenopathy 11/06/2014  . History of renal calculi 02/07/2014   Medications- reviewed and updated Current Outpatient Prescriptions  Medication Sig Dispense Refill  . Prenatal Vit-Fe Fumarate-FA (PRENATAL MULTIVITAMIN) TABS tablet Take 1 tablet by mouth daily at 12 noon.     Objective: BP 120/70 mmHg  Pulse 81  Temp(Src) 98 F (36.7 C)  Wt 152 lb (68.947 kg)  SpO2 97% Gen: NAD, resting comfortably Right tonsil- mild erythema and swelling CV: RRR no murmurs rubs or gallops Lungs: CTAB no crackles, wheeze, rhonchi Abdomen: soft/nontender/nondistended/normal bowel sounds. No rebound or guarding.  r inguinal lymphadenopathy 1x 1 cm.  Ext: no edema Skin: warm, dry, no rash or sign of infection from waist down Neuro: grossly normal, moves all extremities  Assessment/Plan:  Inguinal lymphadenopathy S:noted swollen lymph node in right groin 2.5 weeks ago. Isolated. About 1 cm in size. Has not grown. She's never had anything like this before. No pain or discomfort. Noted when putting clothes on. She has had 2 months productive cough in AM. Sore throat first 3 days then does not Got sick from husband who works in ED. Husband sick for 5-6 weeks. They both had crackles and wheezes which resolved. 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 While there, got traveler's diarrhea and took azithromycin. Day later had tonsilitis which physicians told her was adequate that she was already on azithromycin. That has drastically improved. Azithromycin did not resolve the cough.  ROS-no fever, chills, unintentional weight loss, abnormal  fatigue. No bug bites or infections on legs.  A/P: Isolated lymphoadenopathy in groin (no obvious infectious cause, up to date on pap smear). Remaining malignancy concern could potentially be ovarian source or lymphoma or this could simple be benign and idiopathic. We discussed it has been 2.5 weeks, if persists to 4 weeks, refer to general surgery for open biopsy of node. CBC with diff and CMP today as well- consider earlier referral if abnormalities. Cough could be allergy related- patient declined CXR for 2 months of cough for possible walking PNA.   Touch base by phone end of september  Orders Placed This Encounter  Procedures  . CBC with Differential  . Comprehensive metabolic panel    [place]

## 2014-11-18 ENCOUNTER — Telehealth: Payer: Self-pay | Admitting: Family Medicine

## 2014-11-18 DIAGNOSIS — R590 Localized enlarged lymph nodes: Secondary | ICD-10-CM

## 2014-11-18 NOTE — Telephone Encounter (Signed)
Pt.notified

## 2014-11-18 NOTE — Telephone Encounter (Signed)
Referral to general surgery placed per your note in last OV, any other recommendations for pt?

## 2014-11-18 NOTE — Telephone Encounter (Signed)
Pt was seen on 11-06-14 and had lump in groin area. Per pt was told to callback in 2 wks if lump is still their. Pt is calling to report lump still their. Please advise

## 2014-11-18 NOTE — Telephone Encounter (Signed)
No I would just tell her my hope/expectation is that this will be benign but I think it's best to have it evaluated and for surgery to consider biopsy

## 2014-11-25 ENCOUNTER — Encounter (HOSPITAL_COMMUNITY): Payer: Self-pay | Admitting: *Deleted

## 2014-12-04 ENCOUNTER — Other Ambulatory Visit: Payer: Self-pay | Admitting: Surgery

## 2015-05-18 IMAGING — US US OB COMP LESS 14 WK
1 series · 14 of 28 positions shown · non-contrast
Comparison: Obstetrical ultrasound June 01, 2012

CLINICAL DATA: Determine fetal viability and dating.

EXAM:
OBSTETRIC <14 WK ULTRASOUND
TECHNIQUE: Transabdominal ultrasound was performed for evaluation of the
gestation as well as the maternal uterus and adnexal regions.

[Series 1: us ob comp less 14 wks · 31 acquisitions, 14 frames shown]
[im 2/31]
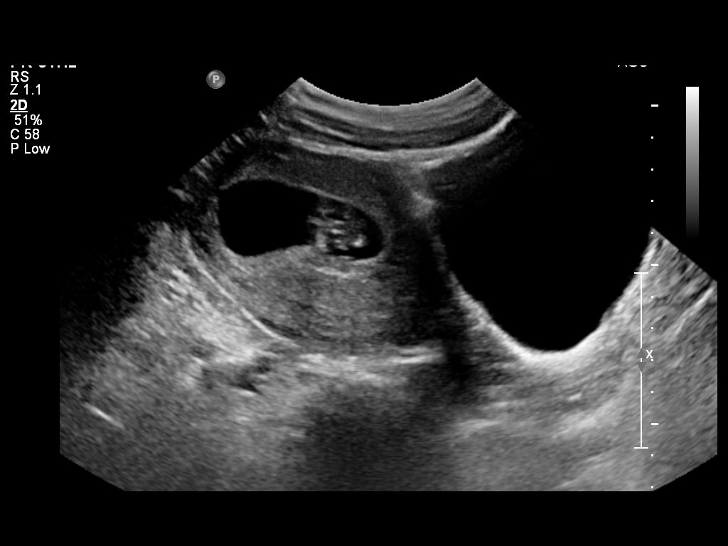
[im 4/31]
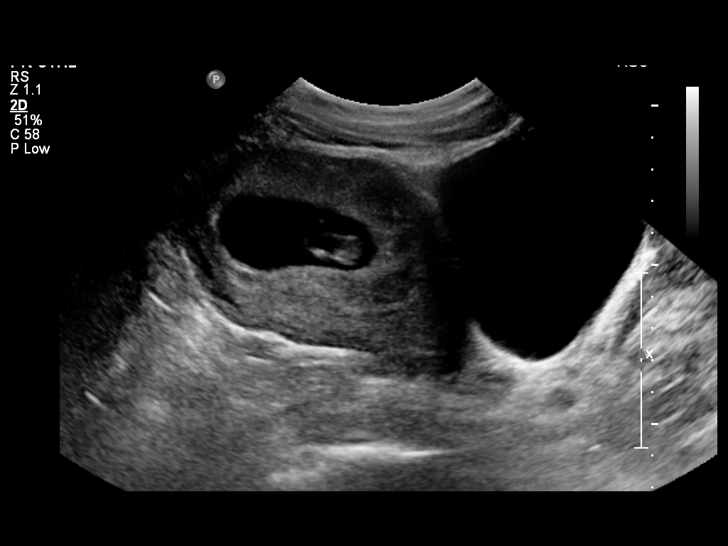
[im 6/31]
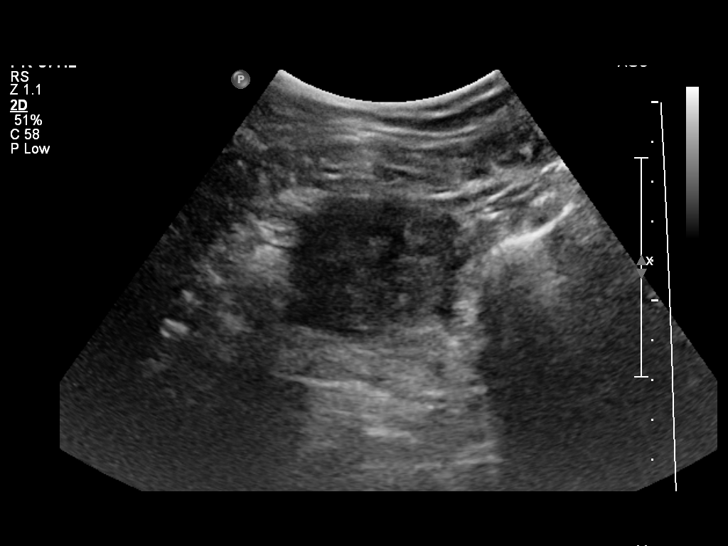
[im 8/31]
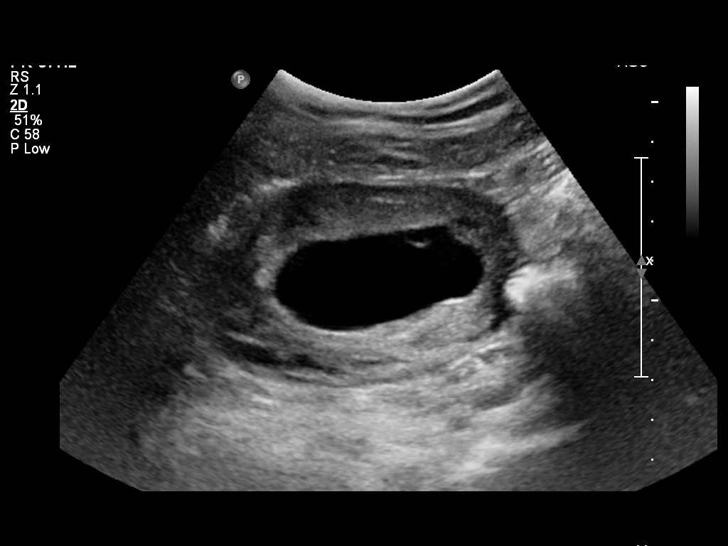
[im 11/31]
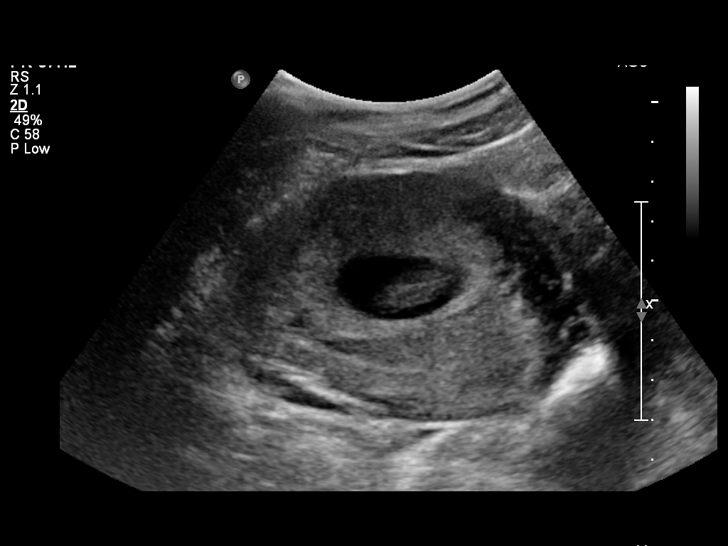
[im 13/31]
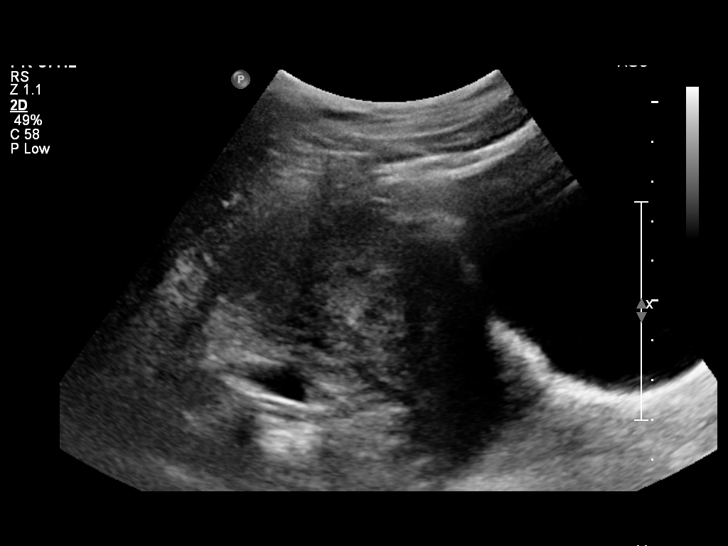
[im 15/31]
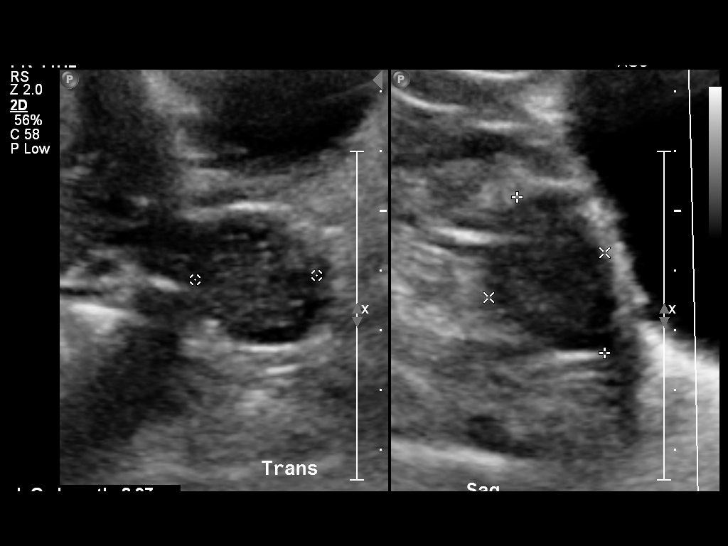
[im 17/31]
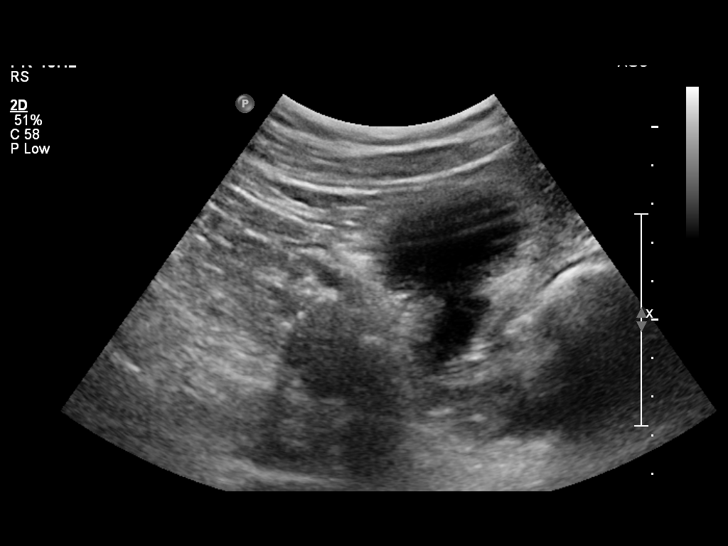
[im 19/31]
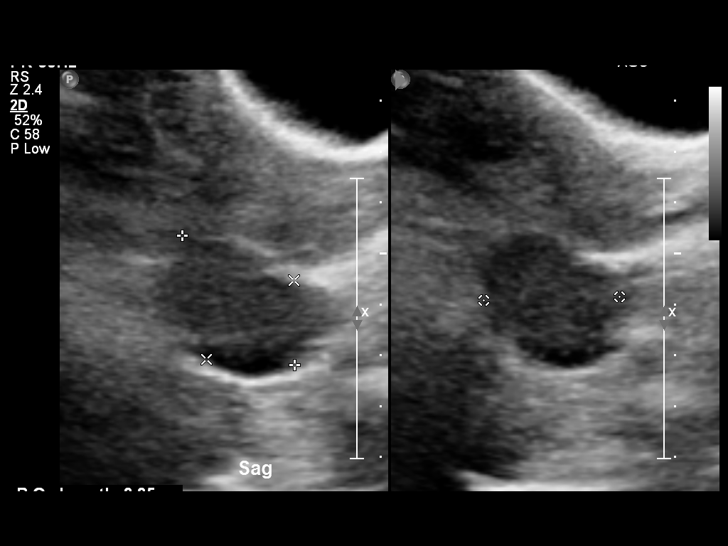
[im 22/31]
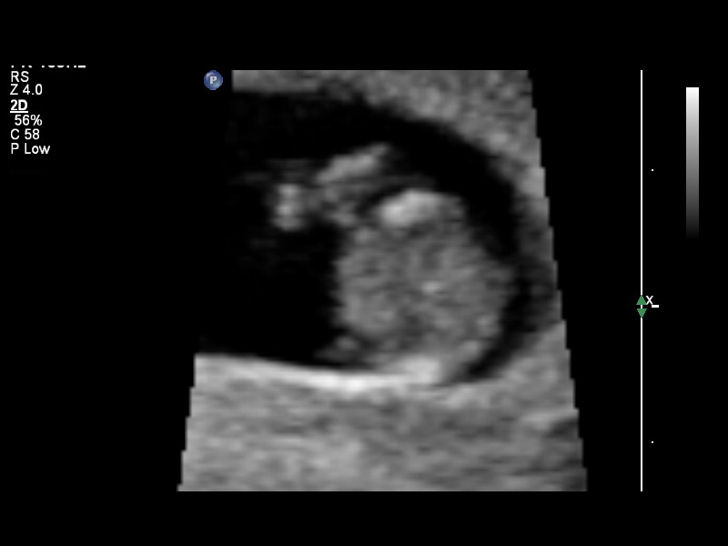
[im 24/31]
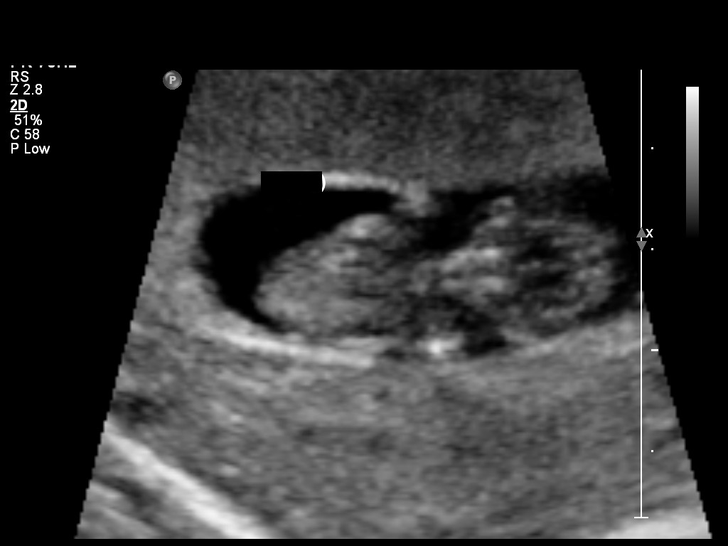
[im 26/31]
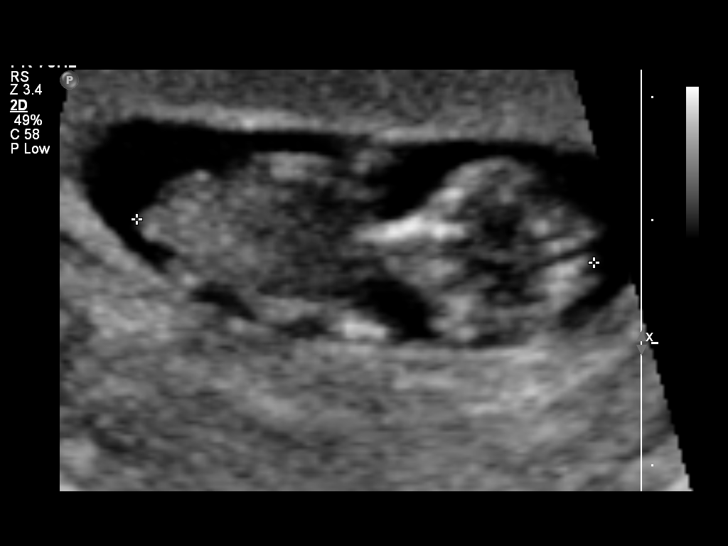
[im 28/31]
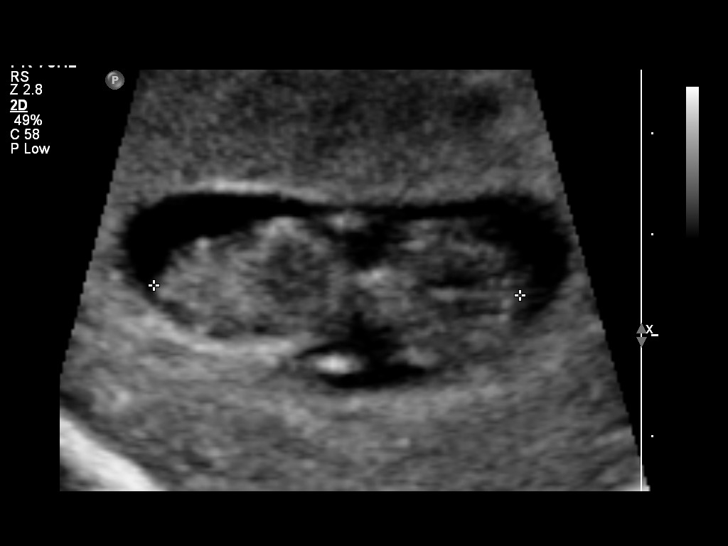
[im 31/31]
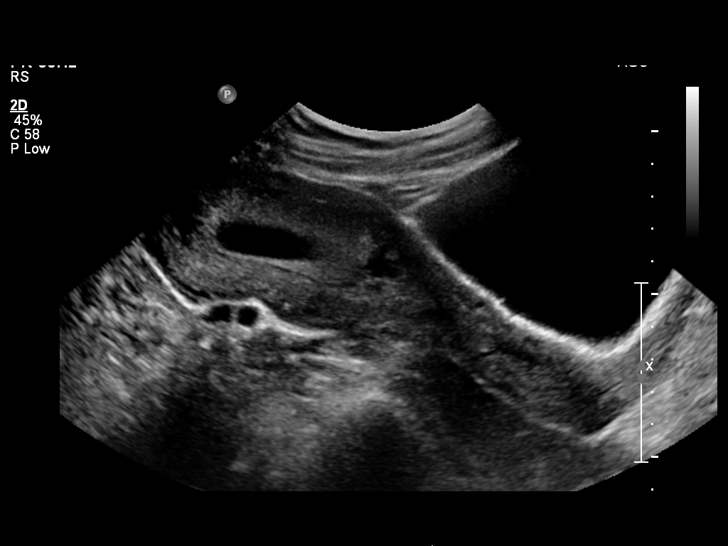

[14 of 28 positions shown; findings below may reference images not displayed]

FINDINGS: Intrauterine gestational sac: Visualized/normal in shape.

Yolk sac:  Present

Embryo:  Present

Cardiac Activity: Present

Heart Rate: 161 bpm

CRL:   37.1  mm   10 w 5 d                  US EDC: May 15, 2014

Maternal uterus/adnexae: No subchorionic hemorrhage. Normal
appearance of the bilateral adnexa. Nor free fluid.
IMPRESSION: Single live intrauterine pregnancy, gestational age 10 weeks and 5
days by ultrasound, EDD May 15, 2014. No immediate complications.

  By: Anne Clara Luvio

## 2015-08-16 IMAGING — US US RENAL
1 series · 14 of 25 positions shown · non-contrast
Comparison: None.

CLINICAL DATA: Pregnant patient. Now with left-sided flank pain and
chills.

EXAM:
RENAL/URINARY TRACT ULTRASOUND COMPLETE

[Series 1: us renal · 0.27mm/px · 14 of 40 slices shown]
[im 1/40]
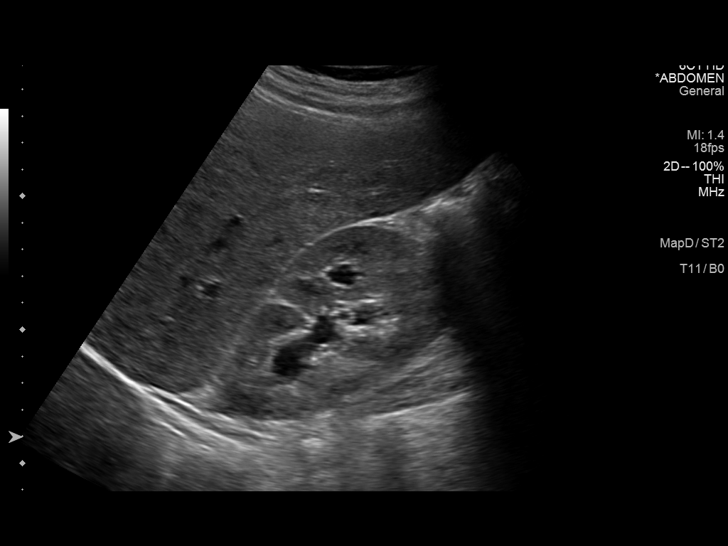
[im 4/40]
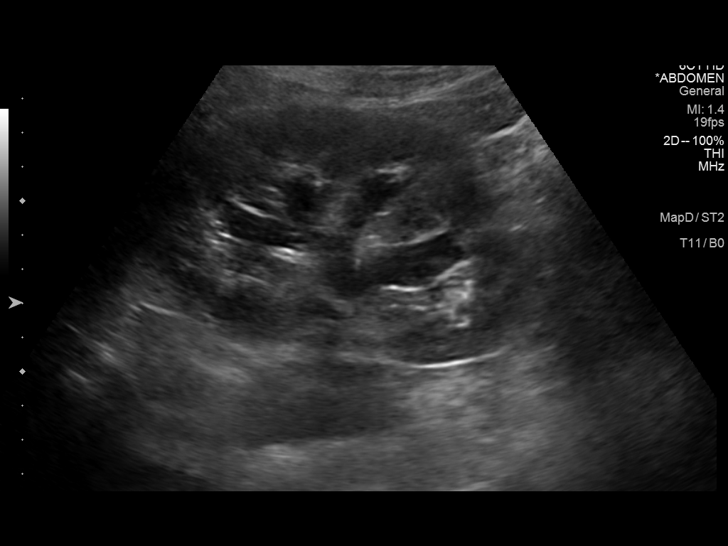
[im 7/40]
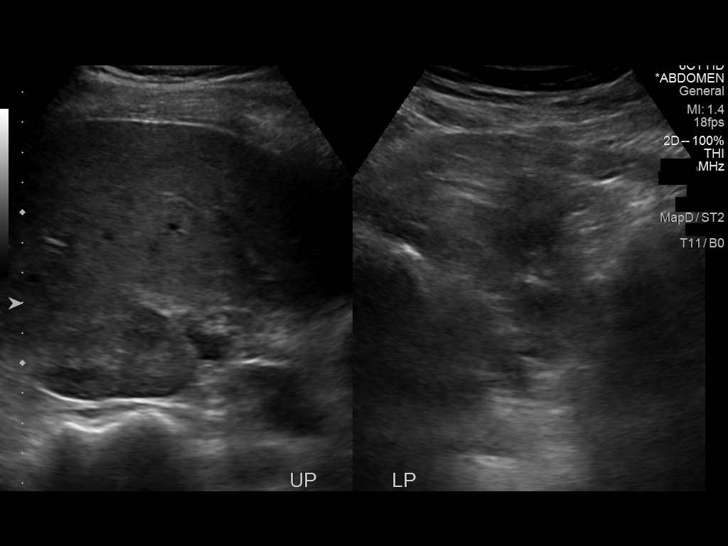
[im 10/40]
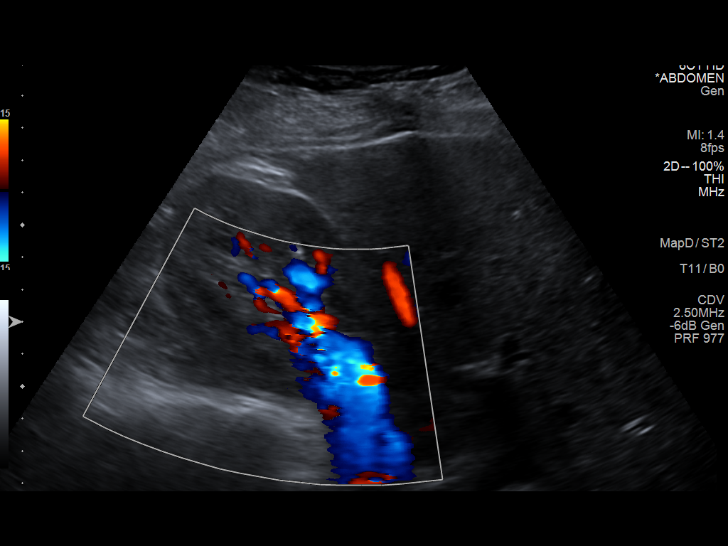
[im 14/40]
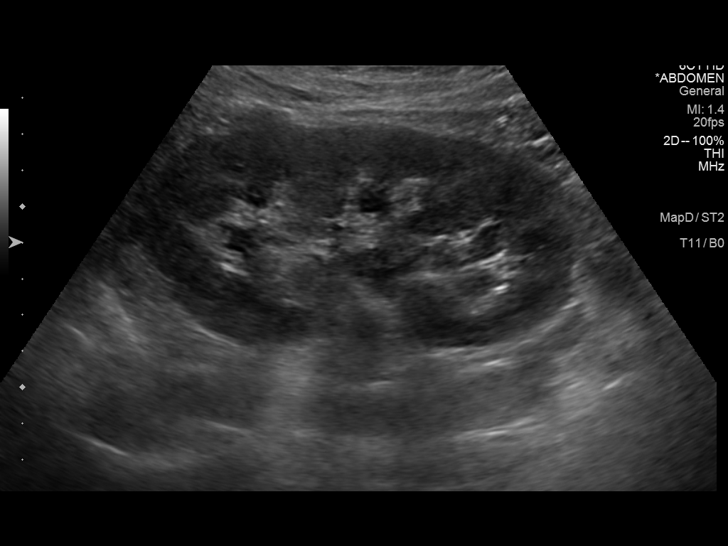
[im 15/40]
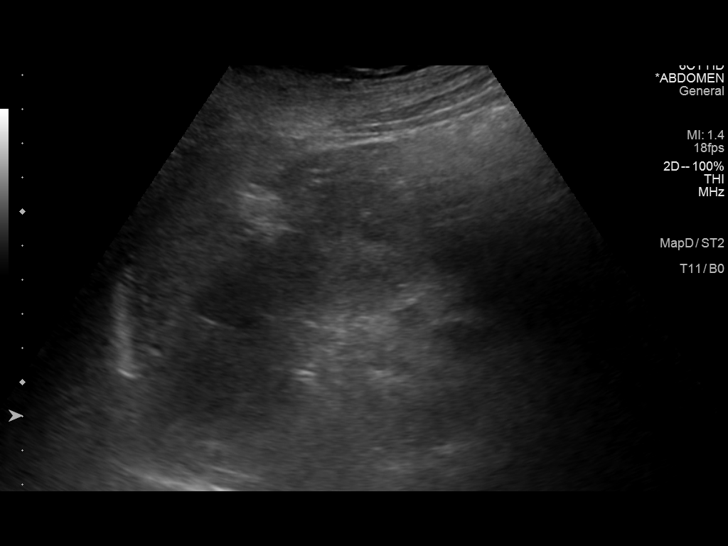
[im 18/40]
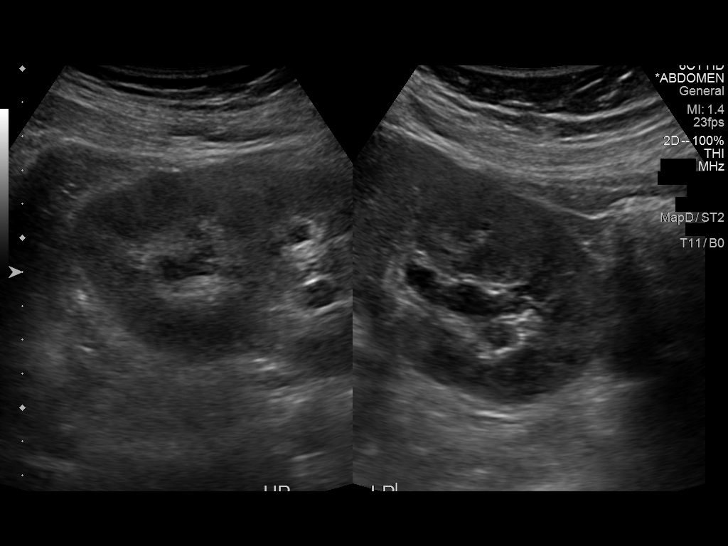
[im 22/40]
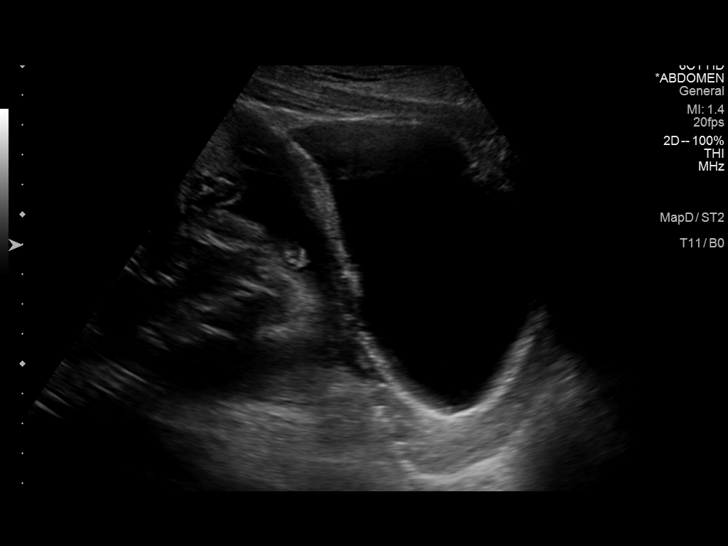
[im 25/40]
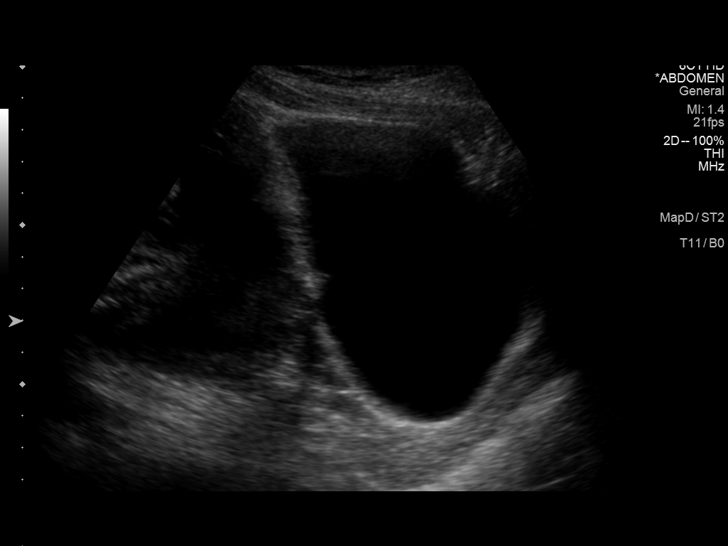
[im 27/40]
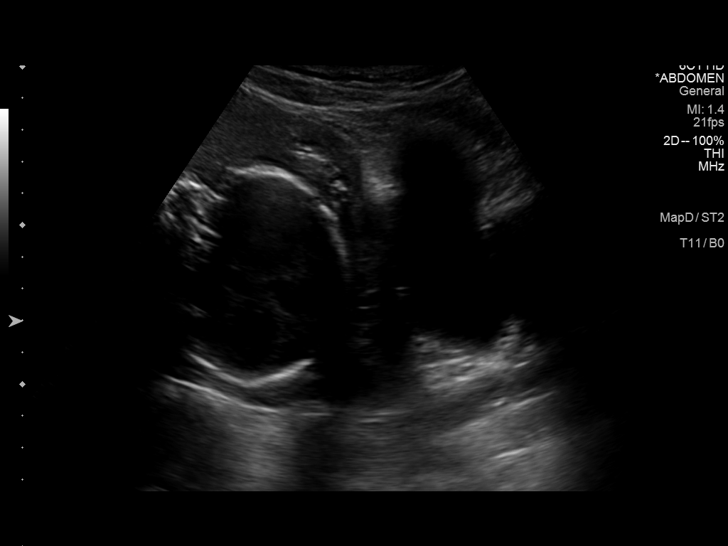
[im 30/40]
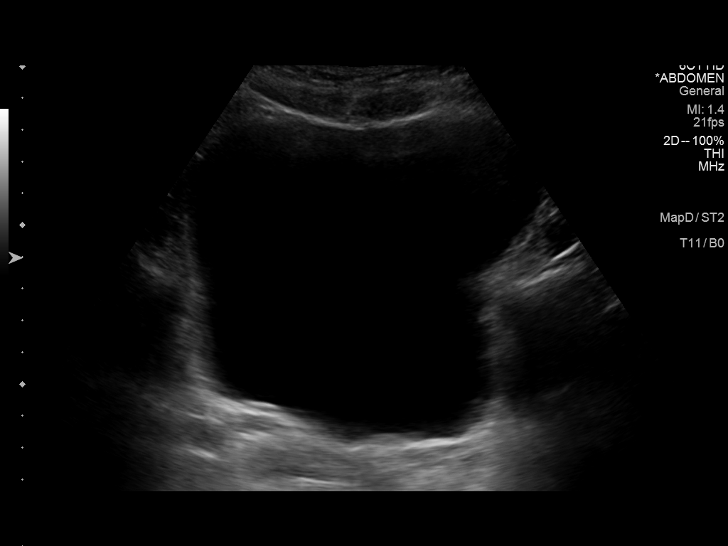
[im 33/40]
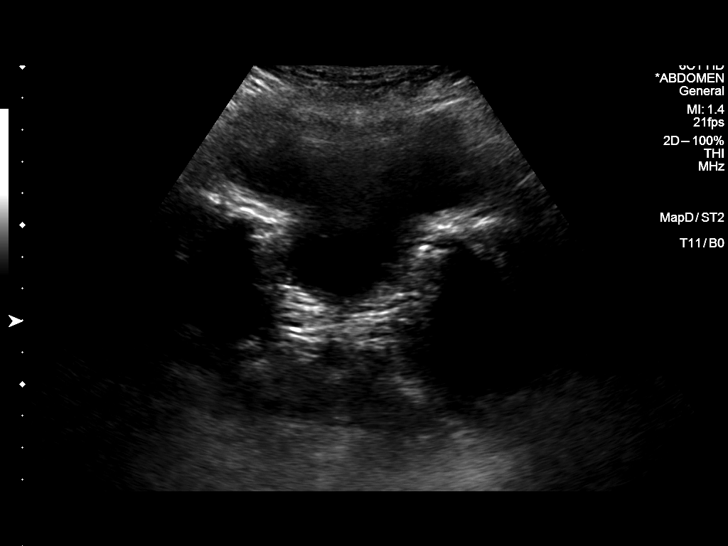
[im 36/40]
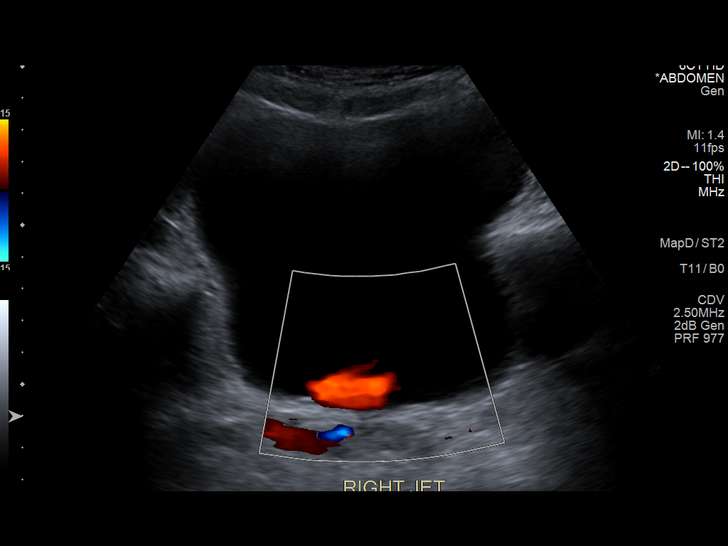
[im 40/40]
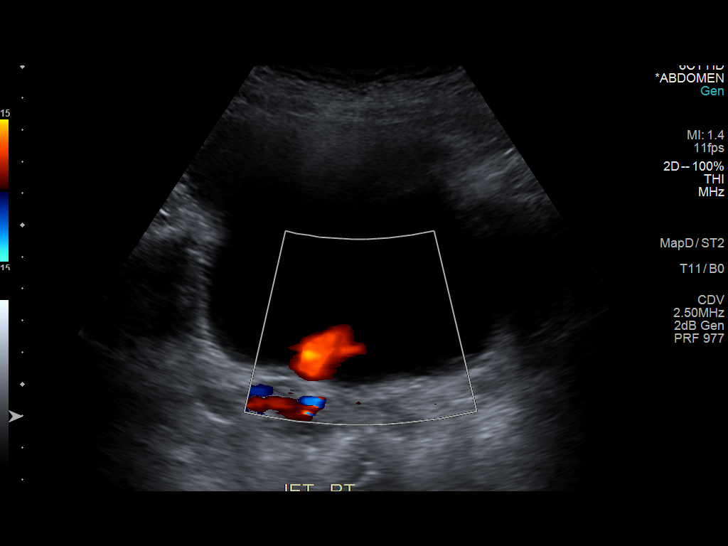

[14 of 25 positions shown; findings below may reference images not displayed]

FINDINGS: Right Kidney:

Normal cortical thickness, echogenicity and size, measuring 11.8 cm
in length. No focal renal lesions. No echogenic renal stones. No
urinary obstruction.

Left Kidney:

Normal cortical thickness, echogenicity and size, measuring 12.2 cm
in length. No focal renal lesions. No echogenic renal stones. There
is very mild left-sided pelvicaliectasis (image 18).

Bladder:

Appears normal for degree of bladder distention. Only a right-sided
ureteral jet is identified.
IMPRESSION: 1. Mild left-sided pelvicaliectasis, potentially the sequela of
patient's gravid state, however there is non visualization of a
left-sided ureteral jet. Follow-up renal ultrasound could be
performed as clinically indicated.
2. No evidence of right-sided urinary obstruction.

## 2016-03-28 ENCOUNTER — Ambulatory Visit (INDEPENDENT_AMBULATORY_CARE_PROVIDER_SITE_OTHER): Payer: PRIVATE HEALTH INSURANCE

## 2016-03-28 DIAGNOSIS — Z23 Encounter for immunization: Secondary | ICD-10-CM

## 2016-05-24 ENCOUNTER — Inpatient Hospital Stay (HOSPITAL_COMMUNITY)
Admission: AD | Admit: 2016-05-24 | Payer: PRIVATE HEALTH INSURANCE | Source: Ambulatory Visit | Admitting: Obstetrics and Gynecology

## 2019-05-25 ENCOUNTER — Ambulatory Visit: Payer: Self-pay | Attending: Internal Medicine

## 2019-05-25 DIAGNOSIS — Z23 Encounter for immunization: Secondary | ICD-10-CM

## 2019-05-25 NOTE — Progress Notes (Signed)
   Covid-19 Vaccination Clinic  Name:  Stacie Chandler    MRN: [id number] DOB: August 15, 1985  05/25/2019  [name] was observed post Covid-19 immunization for 15 minutes without incident. She was provided with Vaccine Information Sheet and instruction to access the V-Safe system.   [name] was instructed to call 911 with any severe reactions post vaccine: Marland Kitchen Difficulty breathing  . Swelling of face and throat  . A fast heartbeat  . A bad rash all over body  . Dizziness and weakness   Immunizations Administered    Name Date Dose VIS Date Route   Pfizer COVID-19 Vaccine 05/25/2019 10:21 AM 0.3 mL 02/01/2019 Intramuscular   Manufacturer: [employer], [employer]   Lot: [id number]   NDC: [address]-7

## 2019-06-19 ENCOUNTER — Ambulatory Visit: Payer: Self-pay | Attending: Internal Medicine

## 2019-06-19 DIAGNOSIS — Z23 Encounter for immunization: Secondary | ICD-10-CM

## 2019-06-19 NOTE — Progress Notes (Signed)
   Covid-19 Vaccination Clinic  Name:  Stacie Chandler    MRN: [id number] DOB: Sep 27, 1985  06/19/2019  [name] was observed post Covid-19 immunization for 15 minutes without incident. She was provided with Vaccine Information Sheet and instruction to access the V-Safe system.   [name] was instructed to call 911 with any severe reactions post vaccine: Marland Kitchen Difficulty breathing  . Swelling of face and throat  . A fast heartbeat  . A bad rash all over body  . Dizziness and weakness   Immunizations Administered    Name Date Dose VIS Date Route   Pfizer COVID-19 Vaccine 06/19/2019  8:32 AM 0.3 mL 04/17/2018 Intramuscular   Manufacturer: [employer], [employer]   Lot: [id number]   NDC: [address]-7

## 2020-02-29 ENCOUNTER — Other Ambulatory Visit: Payer: PRIVATE HEALTH INSURANCE

## 2020-02-29 DIAGNOSIS — Z20822 Contact with and (suspected) exposure to covid-19: Secondary | ICD-10-CM

## 2020-03-03 LAB — NOVEL CORONAVIRUS, NAA: SARS-CoV-2, NAA: NOT DETECTED

## 2020-03-10 ENCOUNTER — Other Ambulatory Visit: Payer: PRIVATE HEALTH INSURANCE

## 2020-03-11 ENCOUNTER — Other Ambulatory Visit: Payer: Self-pay

## 2020-03-11 ENCOUNTER — Other Ambulatory Visit: Payer: PRIVATE HEALTH INSURANCE

## 2020-03-11 DIAGNOSIS — Z20822 Contact with and (suspected) exposure to covid-19: Secondary | ICD-10-CM

## 2020-03-13 LAB — SARS-COV-2, NAA 2 DAY TAT

## 2020-03-13 LAB — NOVEL CORONAVIRUS, NAA: SARS-CoV-2, NAA: DETECTED — AB

## 2021-11-15 ENCOUNTER — Encounter: Payer: Self-pay | Admitting: *Deleted

## 2022-02-03 ENCOUNTER — Encounter: Payer: Self-pay | Admitting: *Deleted
